# Patient Record
Sex: Male | Born: 1970 | Race: Black or African American | Hispanic: No | Marital: Single | Smoking: Never smoker
Health system: Southern US, Community
[De-identification: ages and names within clinical notes are randomized; demographics above are authoritative.]

## PROBLEM LIST (undated history)

## (undated) DIAGNOSIS — I10 Essential (primary) hypertension: Secondary | ICD-10-CM

## (undated) HISTORY — DX: Essential (primary) hypertension: I10

---

## 2005-07-20 ENCOUNTER — Ambulatory Visit: Payer: Self-pay | Admitting: Family Medicine

## 2005-08-29 ENCOUNTER — Ambulatory Visit: Payer: Self-pay | Admitting: Family Medicine

## 2005-11-02 ENCOUNTER — Ambulatory Visit: Payer: Self-pay | Admitting: Family Medicine

## 2005-12-20 ENCOUNTER — Ambulatory Visit: Payer: Self-pay | Admitting: Family Medicine

## 2006-04-11 ENCOUNTER — Ambulatory Visit: Payer: Self-pay | Admitting: Family Medicine

## 2006-07-29 ENCOUNTER — Ambulatory Visit: Payer: Self-pay | Admitting: Family Medicine

## 2006-07-31 ENCOUNTER — Encounter: Payer: Self-pay | Admitting: Family Medicine

## 2006-07-31 DIAGNOSIS — J309 Allergic rhinitis, unspecified: Secondary | ICD-10-CM | POA: Insufficient documentation

## 2006-07-31 DIAGNOSIS — E669 Obesity, unspecified: Secondary | ICD-10-CM

## 2006-07-31 DIAGNOSIS — I10 Essential (primary) hypertension: Secondary | ICD-10-CM | POA: Insufficient documentation

## 2006-12-05 ENCOUNTER — Ambulatory Visit: Payer: Self-pay | Admitting: Family Medicine

## 2006-12-05 DIAGNOSIS — E785 Hyperlipidemia, unspecified: Secondary | ICD-10-CM

## 2007-03-03 ENCOUNTER — Encounter (INDEPENDENT_AMBULATORY_CARE_PROVIDER_SITE_OTHER): Payer: Self-pay | Admitting: Family Medicine

## 2007-03-05 ENCOUNTER — Ambulatory Visit: Payer: Self-pay | Admitting: Family Medicine

## 2007-03-05 LAB — CONVERTED CEMR LAB
ALT: 27 units/L (ref 0–53)
AST: 16 units/L (ref 0–37)
Basophils Absolute: 0 10*3/uL (ref 0.0–0.1)
Basophils Relative: 0 % (ref 0–1)
Calcium: 9.5 mg/dL (ref 8.4–10.5)
Chloride: 103 meq/L (ref 96–112)
Cholesterol, target level: 200 mg/dL
Creatinine, Ser: 1.17 mg/dL (ref 0.40–1.50)
LDL Goal: 160 mg/dL
MCHC: 33.8 g/dL (ref 30.0–36.0)
Monocytes Absolute: 0.9 10*3/uL — ABNORMAL HIGH (ref 0.2–0.7)
Neutro Abs: 6.4 10*3/uL (ref 1.7–7.7)
Neutrophils Relative %: 58 % (ref 43–77)
Platelets: 314 10*3/uL (ref 150–400)
Potassium: 4.3 meq/L (ref 3.5–5.3)
RDW: 13.6 % (ref 11.5–14.0)
Sodium: 140 meq/L (ref 135–145)
Total CHOL/HDL Ratio: 3.9
Total Protein: 7.5 g/dL (ref 6.0–8.3)
VLDL: 19 mg/dL (ref 0–40)

## 2007-08-19 ENCOUNTER — Ambulatory Visit: Payer: Self-pay | Admitting: Family Medicine

## 2007-08-20 ENCOUNTER — Telehealth (INDEPENDENT_AMBULATORY_CARE_PROVIDER_SITE_OTHER): Payer: Self-pay | Admitting: *Deleted

## 2007-08-20 ENCOUNTER — Encounter (INDEPENDENT_AMBULATORY_CARE_PROVIDER_SITE_OTHER): Payer: Self-pay | Admitting: Family Medicine

## 2007-08-20 LAB — CONVERTED CEMR LAB
BUN: 18 mg/dL (ref 6–23)
Creatinine, Ser: 1.03 mg/dL (ref 0.40–1.20)
Potassium: 3.9 meq/L (ref 3.5–5.3)

## 2007-08-21 ENCOUNTER — Encounter: Payer: Self-pay | Admitting: Family Medicine

## 2007-11-19 ENCOUNTER — Ambulatory Visit: Payer: Self-pay | Admitting: Family Medicine

## 2008-03-23 ENCOUNTER — Ambulatory Visit: Payer: Self-pay | Admitting: Family Medicine

## 2008-03-24 ENCOUNTER — Encounter (INDEPENDENT_AMBULATORY_CARE_PROVIDER_SITE_OTHER): Payer: Self-pay | Admitting: Family Medicine

## 2008-03-24 LAB — CONVERTED CEMR LAB
Potassium: 4 meq/L (ref 3.5–5.3)
Sodium: 138 meq/L (ref 135–145)

## 2008-04-20 ENCOUNTER — Encounter (INDEPENDENT_AMBULATORY_CARE_PROVIDER_SITE_OTHER): Payer: Self-pay | Admitting: Family Medicine

## 2008-08-10 ENCOUNTER — Ambulatory Visit: Payer: Self-pay | Admitting: Family Medicine

## 2009-01-26 ENCOUNTER — Encounter (INDEPENDENT_AMBULATORY_CARE_PROVIDER_SITE_OTHER): Payer: Self-pay | Admitting: Family Medicine

## 2009-04-28 ENCOUNTER — Ambulatory Visit: Payer: Self-pay | Admitting: Family Medicine

## 2009-05-02 ENCOUNTER — Encounter (INDEPENDENT_AMBULATORY_CARE_PROVIDER_SITE_OTHER): Payer: Self-pay | Admitting: Family Medicine

## 2009-05-03 LAB — CONVERTED CEMR LAB
Albumin: 4.2 g/dL (ref 3.5–5.2)
Bilirubin Urine: NEGATIVE
CO2: 25 meq/L (ref 19–32)
Cholesterol: 175 mg/dL (ref 0–200)
Glucose, Bld: 99 mg/dL (ref 70–99)
Ketones, ur: NEGATIVE mg/dL
Nitrite: NEGATIVE
Potassium: 4.4 meq/L (ref 3.5–5.3)
Sodium: 140 meq/L (ref 135–145)
Specific Gravity, Urine: 1.017 (ref 1.005–1.030)
Total Protein: 7.3 g/dL (ref 6.0–8.3)
Triglycerides: 117 mg/dL (ref <150)
pH: 6 (ref 5.0–8.0)

## 2010-03-26 ENCOUNTER — Emergency Department (HOSPITAL_COMMUNITY): Admission: EM | Admit: 2010-03-26 | Discharge: 2010-03-26 | Payer: Self-pay | Admitting: Emergency Medicine

## 2010-09-19 NOTE — Letter (Signed)
Summary: Historic Patient File  Historic Patient File   Imported By: Lind Guest 05/22/2010 15:50:41  _____________________________________________________________________  External Attachment:    Type:   Image     Comment:   External Document

## 2015-02-10 ENCOUNTER — Ambulatory Visit (INDEPENDENT_AMBULATORY_CARE_PROVIDER_SITE_OTHER): Payer: BLUE CROSS/BLUE SHIELD | Admitting: Orthopedic Surgery

## 2015-02-10 ENCOUNTER — Ambulatory Visit (INDEPENDENT_AMBULATORY_CARE_PROVIDER_SITE_OTHER): Payer: BLUE CROSS/BLUE SHIELD

## 2015-02-10 ENCOUNTER — Encounter: Payer: Self-pay | Admitting: Orthopedic Surgery

## 2015-02-10 VITALS — BP 127/83 | Ht 68.0 in | Wt 230.0 lb

## 2015-02-10 DIAGNOSIS — M25522 Pain in left elbow: Secondary | ICD-10-CM

## 2015-02-10 DIAGNOSIS — M7712 Lateral epicondylitis, left elbow: Secondary | ICD-10-CM | POA: Diagnosis not present

## 2015-02-10 MED ORDER — DICLOFENAC SODIUM 75 MG PO TBEC: 75.0000 mg | DELAYED_RELEASE_TABLET | Freq: Two times a day (BID) | ORAL | Status: DC

## 2015-02-10 NOTE — Patient Instructions (Signed)
Joint Injection  Care After  Refer to this sheet in the next few days. These instructions provide you with information on caring for yourself after you have had a joint injection. Your caregiver also may give you more specific instructions. Your treatment has been planned according to current medical practices, but problems sometimes occur. Call your caregiver if you have any problems or questions after your procedure.  After any type of joint injection, it is not uncommon to experience:  · Soreness, swelling, or bruising around the injection site.  · Mild numbness, tingling, or weakness around the injection site caused by the numbing medicine used before or with the injection.  It also is possible to experience the following effects associated with the specific agent after injection:  · Iodine-based contrast agents:  ¨ Allergic reaction (itching, hives, widespread redness, and swelling beyond the injection site).  · Corticosteroids (These effects are rare.):  ¨ Allergic reaction.  ¨ Increased blood sugar levels (If you have diabetes and you notice that your blood sugar levels have increased, notify your caregiver).  ¨ Increased blood pressure levels.  ¨ Mood swings.  · Hyaluronic acid in the use of viscosupplementation.  ¨ Temporary heat or redness.  ¨ Temporary rash and itching.  ¨ Increased fluid accumulation in the injected joint.  These effects all should resolve within a day after your procedure.   HOME CARE INSTRUCTIONS  · Limit yourself to light activity the day of your procedure. Avoid lifting heavy objects, bending, stooping, or twisting.  · Take prescription or over-the-counter pain medication as directed by your caregiver.  · You may apply ice to your injection site to reduce pain and swelling the day of your procedure. Ice may be applied 03-04 times:  ¨ Put ice in a plastic bag.  ¨ Place a towel between your skin and the bag.  ¨ Leave the ice on for no longer than 15-20 minutes each time.  SEEK  IMMEDIATE MEDICAL CARE IF:   · Pain and swelling get worse rather than better or extend beyond the injection site.  · Numbness does not go away.  · Blood or fluid continues to leak from the injection site.  · You have chest pain.  · You have swelling of your face or tongue.  · You have trouble breathing or you become dizzy.  · You develop a fever, chills, or severe tenderness at the injection site that last longer than 1 day.  MAKE SURE YOU:  · Understand these instructions.  · Watch your condition.  · Get help right away if you are not doing well or if you get worse.  Document Released: 04/19/2011 Document Revised: 10/29/2011 Document Reviewed: 04/19/2011  ExitCare® Patient Information ©2015 [employer]. This information is not intended to replace advice given to you by your health care provider. Make sure you discuss any questions you have with your health care provider.

## 2015-02-10 NOTE — Progress Notes (Signed)
NEW  Patient ID: Shaun Mcclure, male   DOB: May 12, 1971, 44 y.o.   MRN: [id number]  Chief Complaint  Patient presents with  . Follow-up    Left elbow pain, ref z hall     Shaun Mcclure is a 44 y.o. male.   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 Presents with two-month history of lateral elbow pain. He did have some medial elbow pain which went away without treatment. He took 2 weeks of diclofenac he didn't get any better. He complains of pain, swelling, stiffness lateral aspect left elbow described as aching and throbbing constant 6 out of 10. Certain activities increase the pain he also took some over-the-counter Advil without relief  He's healthy he has seasonal allergies and otherwise normal review of systems Review of Systems See hpi  Past Medical History  Diagnosis Date  . Hypertension     No past surgical history on file.  No family history on file.  Social History History  Substance Use Topics  . Smoking status: Not on file  . Smokeless tobacco: Not on file  . Alcohol Use: Not on file    Not on File  Current Outpatient Prescriptions  Medication Sig Dispense Refill  . diclofenac (VOLTAREN) 75 MG EC tablet Take 1 tablet (75 mg total) by mouth 2 (two) times daily with a meal. 60 tablet 0   No current facility-administered medications for this visit.       Physical Exam Blood pressure 127/83, height 5\' 8"  (1.727 m), weight 230 lb (104.327 kg). Physical Exam The patient is well developed well nourished and well groomed. Orientation to person place and time is normal  Mood is pleasant. Epitrochlear lymph nodes are normal. He has no swelling over the lateral portion of his elbow he has full range of motion. The elbow is stable to varus valgus stress testing and the motor exam is normal there is no atrophy. The skin shows no rash lesion or erythema. Normal sensation in the hand and  wrist and normal pulse without lymphadenopathy.    Data Reviewed [facility] lateral x-ray left elbow ordered and I interpreted that as normal  Assessment Encounter Diagnosis  Name Primary?  . Tennis elbow, left Yes    Plan Injection, continue diclofenac for 4 weeks. Tennis elbow brace. Ice massage. Recheck 4 weeks.

## 2015-03-29 ENCOUNTER — Ambulatory Visit (INDEPENDENT_AMBULATORY_CARE_PROVIDER_SITE_OTHER): Payer: BLUE CROSS/BLUE SHIELD | Admitting: Orthopedic Surgery

## 2015-03-29 VITALS — BP 123/79 | Ht 68.0 in | Wt 224.0 lb

## 2015-03-29 DIAGNOSIS — M654 Radial styloid tenosynovitis [de Quervain]: Secondary | ICD-10-CM

## 2015-03-29 NOTE — Progress Notes (Signed)
Patient ID: Shaun Mcclure, male   DOB: [birth year], 44 y.o.   MRN: [id number]  Follow up visit  Chief Complaint  Patient presents with  . Follow-up    6 week follow up left elbow s/p brace + injection    BP 123/79 mmHg  Ht  (1.727 m)  Wt 224 lb (101.606 kg)  BMI 34.07 kg/m2  No diagnosis found.  Follow-up for tennis elbow  Patient also has pain in his left wrist did complain of that last time available seem to be worse retreated that with an injection in a brace  His wrist is hurting is painful ulnar deviation tenderness over the first extensor compartment  He has obvious de Quervain's syndrome  We gave him options of anti-inflammatory medication topical medication or injection. He settled on injection  We injected the first extensor compartment tendon sheath  Verbal consent timeout to confirm left wrist first extensor compartment as injection site  We did alcohol and ethyl chloride to prepare for injection then injected 40 mg of Depo-Medrol into the tendon sheath of the first extensor compartment no comp  Follow-up 6 weeks

## 2015-05-10 ENCOUNTER — Encounter: Payer: Self-pay | Admitting: Orthopedic Surgery

## 2015-05-10 ENCOUNTER — Ambulatory Visit: Payer: BLUE CROSS/BLUE SHIELD | Admitting: Orthopedic Surgery

## 2016-05-05 ENCOUNTER — Emergency Department (HOSPITAL_COMMUNITY)
Admission: EM | Admit: 2016-05-05 | Discharge: 2016-05-05 | Disposition: A | Discharge status: Home / Self Care | Payer: BLUE CROSS/BLUE SHIELD | Attending: Emergency Medicine | Admitting: Emergency Medicine

## 2016-05-05 ENCOUNTER — Encounter (HOSPITAL_COMMUNITY): Payer: Self-pay

## 2016-05-05 DIAGNOSIS — Z79899 Other long term (current) drug therapy: Secondary | ICD-10-CM | POA: Insufficient documentation

## 2016-05-05 DIAGNOSIS — M545 Low back pain, unspecified: Secondary | ICD-10-CM

## 2016-05-05 DIAGNOSIS — I10 Essential (primary) hypertension: Secondary | ICD-10-CM | POA: Insufficient documentation

## 2016-05-05 MED ORDER — KETOROLAC TROMETHAMINE 60 MG/2ML IM SOLN
60.0000 mg | Freq: Once | INTRAMUSCULAR | Status: AC
  Administered 2016-05-05: 60 mg via INTRAMUSCULAR
  Filled 2016-05-05: qty 2

## 2016-05-05 MED ORDER — METHOCARBAMOL 500 MG PO TABS: 500.0000 mg | ORAL_TABLET | Freq: Two times a day (BID) | ORAL | 0 refills | Status: AC | PRN

## 2016-05-05 MED ORDER — MELOXICAM 15 MG PO TABS: 15.0000 mg | ORAL_TABLET | Freq: Every day | ORAL | 0 refills | Status: AC

## 2016-05-05 MED ORDER — DEXAMETHASONE SODIUM PHOSPHATE 10 MG/ML IJ SOLN
6.0000 mg | Freq: Once | INTRAMUSCULAR | Status: AC
  Administered 2016-05-05: 6 mg via INTRAMUSCULAR
  Filled 2016-05-05: qty 1

## 2016-05-05 NOTE — ED Provider Notes (Signed)
[facility]-EMER[facility]ENCY DEPT Provider Note   CSN: [id number] Arrival date & time: 05/05/16  2004  By signing my name below, I, Shaun Mcclure, attest that this documentation has been prepared under the direction and in the presence of Shaun HongBrian Aarohi Redditt, MD. Electronically Signed: Javier Dockerobert Ryan Mcclure, ER Scribe. 03/31/2016. 10:17 PM.   History   Chief Complaint Chief Complaint  Patient presents with  . Back Pain   The history is provided by the patient. No language interpreter was used.    HPI Comments: Shaun Mcclure is a 45 y.o. male who presents to the Emergency Department complaining of stabbing, burning low back pain since yesterday afternoon that radiates to his right knee. He denies cancer, numbness or weakness in legs, flank pain, or dysuria. He denies loss of bowl or bladder control. Denies red flags for pathologic back pain.  Sx are constant, worse with movement.  No hx of back surgery   Past Medical History:  Diagnosis Date  . Hypertension     Patient Active Problem List   Diagnosis Date Noted  . De Quervain's syndrome (tenosynovitis) 03/29/2015  . HYPERLIPIDEMIA 12/05/2006  . OBESITY 07/31/2006  . HYPERTENSION 07/31/2006  . ALLERGIC RHINITIS 07/31/2006    History reviewed. No pertinent surgical history.   Home Medications    Prior to Admission medications   Medication Sig Start Date End Date Taking? Authorizing Provider  lisinopril (PRINIVIL,ZESTRIL) 40 MG tablet Take 40 mg by mouth daily.    Historical Provider, MD  meloxicam (MOBIC) 15 MG tablet Take 1 tablet (15 mg total) by mouth daily. 05/05/16   Shaun HongBrian Shaun Armijo, MD  methocarbamol (ROBAXIN) 500 MG tablet Take 1 tablet (500 mg total) by mouth 2 (two) times daily as needed for muscle spasms. 05/05/16   Shaun HongBrian Shaun Belknap, MD    Family History History reviewed. No pertinent family history.  Social History Social History  Substance Use Topics  . Smoking status: Never Smoker  . Smokeless tobacco: Never Used  . Alcohol use Not  on file     Allergies   Review of patient's allergies indicates no known allergies.   Review of Systems Review of Systems  Constitutional: Negative for chills and fever.  Genitourinary: Negative for dysuria, flank pain, frequency and urgency.  Musculoskeletal: Positive for back pain. Negative for joint swelling.  Neurological: Negative for weakness and numbness.    Physical Exam Updated Vital Signs BP 157/96 (BP Location: Left Arm)   Pulse 66   Temp 98.2 F (36.8 C) (Oral)   Resp 20   Ht 5\' 9"  (1.753 m)   Wt 215 lb (97.5 kg)   SpO2 97%   BMI 31.75 kg/m   Physical Exam  Constitutional: He is oriented to person, place, and time. He appears well-developed and well-nourished. No distress.  HENT:  Head: Normocephalic and atraumatic.  Eyes: Pupils are equal, round, and reactive to light.  Neck: Neck supple.  Cardiovascular: Normal rate.   Pulmonary/Chest: Effort normal. No respiratory distress.  Musculoskeletal: Normal range of motion.  Reproducible TTP over right SI joint and lumbar paraspinal muscles.   Neurological: He is alert and oriented to person, place, and time. Coordination normal.  Normal strength and sensation of the bilateral lower extremities, normal gait  Skin: Skin is warm and dry. He is not diaphoretic.  Psychiatric: He has a normal mood and affect. His behavior is normal.  Nursing note and vitals reviewed.    ED Treatments / Results  DIAGNOSTIC STUDIES: Oxygen Saturation is 97% on RA,  normal by my interpretation.    COORDINATION OF CARE: 10:20 PM Discussed treatment plan with pt at bedside which includes IM steroids and pt agreed to plan.  Labs (all labs ordered are listed, but only abnormal results are displayed) Labs Reviewed - No data to display  EKG  EKG Interpretation None       Radiology No results found.  Procedures Procedures (including critical care time)  Medications Ordered in ED Medications  ketorolac (TORADOL) injection  60 mg (60 mg Intramuscular Given 05/05/16 2306)  dexamethasone (DECADRON) injection 6 mg (6 mg Intramuscular Given 05/05/16 2307)     Initial Impression / Assessment and Plan / ED Course  I have reviewed the triage vital signs and the nursing notes.  Pertinent labs & imaging results that were available during my care of the patient were reviewed by me and considered in my medical decision making (see chart for details).  Clinical Course    Decadron given, pain medication for home as below, likely musculoskeletal cause of pain, doubt pathologic source  I personally performed the services described in this documentation, which was scribed in my presence. The recorded information has been reviewed and is accurate.     Final Clinical Impressions(s) / ED Diagnoses   Final diagnoses:  Right-sided low back pain without sciatica    New Prescriptions New Prescriptions   MELOXICAM (MOBIC) 15 MG TABLET    Take 1 tablet (15 mg total) by mouth daily.   METHOCARBAMOL (ROBAXIN) 500 MG TABLET    Take 1 tablet (500 mg total) by mouth 2 (two) times daily as needed for muscle spasms.               Shaun Hong, MD 05/05/16 [id number]

## 2016-05-05 NOTE — ED Notes (Addendum)
Pt report slight improvement in pain down to 8/10 after IM medications given. Pt now report that "its wore off" and is pain is now back to what it was upon arrival. Shaun Mcclure made aware, no new orders, discharge instructions reviewed with patient. Teachback

## 2016-05-05 NOTE — ED Notes (Signed)
Numerous family members to nursing station upset stating "he was suppose to get a shot", "his pain is worse", "he is deteriorating". Explained to both family members, on two occasions that no orders has been placed yet, and that I will inquire with Dr. Hyacinth MeekerMiller regarding pain medication

## 2016-05-05 NOTE — ED Triage Notes (Signed)
Patient states he was working out yesterday and started having lower back pain on the right side, with radiation into right leg. Patient denies any other pain or symptoms. Patient was seen at urgent care this morning and prescribed mobic and flexeril states pain has not improved. Patent ambulatory into triage with steady gait.

## 2016-05-05 NOTE — Discharge Instructions (Signed)

## 2019-07-13 ENCOUNTER — Ambulatory Visit (INDEPENDENT_AMBULATORY_CARE_PROVIDER_SITE_OTHER): Payer: Commercial Managed Care - PPO

## 2019-07-13 ENCOUNTER — Other Ambulatory Visit: Payer: Self-pay

## 2019-07-13 ENCOUNTER — Ambulatory Visit (INDEPENDENT_AMBULATORY_CARE_PROVIDER_SITE_OTHER): Payer: Commercial Managed Care - PPO | Admitting: Orthopedic Surgery

## 2019-07-13 VITALS — BP 202/121 | HR 88 | Temp 97.9°F | Ht 69.0 in | Wt 236.4 lb

## 2019-07-13 DIAGNOSIS — M25562 Pain in left knee: Secondary | ICD-10-CM

## 2019-07-13 NOTE — Progress Notes (Signed)
Shaun Mcclure  07/13/2019  Body mass index is 34.91 kg/m.   HISTORY SECTION :  Chief Complaint  Patient presents with  . New Patient (Initial Visit)    Left knee pain-stepped in hole end of summer   HPI The patient presents for evaluation of  (mild/moderate/severe/ ) mild to moderate pain, in the (right /left) left knee, for since this summer when he stepped in a hole, associated with swelling.  Prior treatment none  It seems he stepped in a hole at the end of the summer rested it got better then he stepped off a ladder at work and twisted it again and since then he has had pain in the back of the knee with swelling not a lot of motion deficit   Review of Systems  All other systems reviewed and are negative.    has a past medical history of Hypertension.   No past surgical history on file.  Body mass index is 34.91 kg/m.   No Known Allergies   Current Outpatient Medications:  .  lisinopril (PRINIVIL,ZESTRIL) 40 MG tablet, Take 40 mg by mouth daily., Disp: , Rfl:  .  meloxicam (MOBIC) 15 MG tablet, Take 1 tablet (15 mg total) by mouth daily. (Patient not taking: Reported on 07/13/2019), Disp: 30 tablet, Rfl: 0 .  methocarbamol (ROBAXIN) 500 MG tablet, Take 1 tablet (500 mg total) by mouth 2 (two) times daily as needed for muscle spasms. (Patient not taking: Reported on 07/13/2019), Disp: 20 tablet, Rfl: 0   PHYSICAL EXAM SECTION: 1) BP (!) 202/121   Pulse 88   Temp 97.9 F (36.6 C)   Ht 5\' 9"  (1.753 m)   Wt 236 lb 6.4 oz (107.2 kg)   BMI 34.91 kg/m   Body mass index is 34.91 kg/m. General appearance: Well-developed well-nourished no gross deformities  2) Cardiovascular normal pulse and perfusion in the lower extremities normal color without edema  3) Neurologically deep tendon reflexes are equal and normal, no sensation loss or deficits no pathologic reflexes  4) Psychological: Awake alert and oriented x3 mood and affect normal  5) Skin no lacerations or  ulcerations no nodularity no palpable masses, no erythema or nodularity  6) Musculoskeletal:  Right knee normal range of motion stability and strength  Left knee large joint effusion however he can still flex the knee does not straighten all the way though he has no tenderness in the joint lines all the pain is in the back of the knee McMurray's test were negative muscle tone was normal   MEDICAL DECISION SECTION:  Encounter Diagnosis  Name Primary?  . Left knee pain, unspecified chronicity Yes    Imaging X-ray see report but it does show some mild arthritis medial compartment  Differential diagnosis meniscal tear is included  Plan:  (Rx., Inj., surg., Frx, MRI/CT, XR:2)  Aspiration injection left knee ibuprofen twice a day ice follow-up in 2 weeks if no improvement MRI  Procedure note injection and aspiration left knee joint  Verbal consent was obtained to aspirate and inject the left knee joint   Timeout was completed to confirm the site of aspiration and injection  An 18-gauge needle was used to aspirate the left knee joint from a suprapatellar lateral approach.  The medications used were 40 mg of Depo-Medrol and 1% lidocaine 3 cc  Anesthesia was provided by ethyl chloride and the skin was prepped with alcohol.  After cleaning the skin with alcohol an 18-gauge needle was used to aspirate the  right knee joint.  We obtained 45 cc of fluid clear with a few particles of cartilage in the knee  We followed this by injection of 40 mg of Depo-Medrol and 3 cc 1% lidocaine.  There were no complications. A sterile bandage was applied.  3:59 PM Fuller Canada, MD  07/13/2019

## 2019-07-13 NOTE — Patient Instructions (Addendum)
Ice daily   Ibuprofen 400 mg 2 x a day    Meniscus Tear  A meniscus tear is a knee injury that happens when a piece of the meniscus is torn. The meniscus is a thick, rubbery, wedge-shaped cartilage in the knee. Two menisci are located in each knee. They sit between the upper bone (femur) and lower bone (tibia) that make up the knee joint. Each meniscus acts as a shock absorber for the knee. A torn meniscus is one of the most common types of knee injuries. This injury can range from mild to severe. Surgery may be needed to repair a severe tear. What are the causes? This condition may be caused by any kneeling, squatting, twisting, or pivoting movement. Sports-related injuries are the most common cause. These often occur from:  Running and stopping suddenly. ? Changing direction. ? Being tackled or knocked off your feet.  Lifting or carrying heavy weights. As people get older, their menisci get thinner and weaker. In these people, tears can happen more easily, such as from climbing stairs. What increases the risk? You are more likely to develop this condition if you:  Play contact sports.  Have a job that requires kneeling or squatting.  Are male.  Are over 70 years old. What are the signs or symptoms? Symptoms of this condition include:  Knee pain, especially at the side of the knee joint. You may feel pain when the injury occurs, or you may only hear a pop and feel pain later.  A feeling that your knee is clicking, catching, locking, or giving way.  Not being able to fully bend or extend your knee.  Bruising or swelling in your knee. How is this diagnosed? This condition may be diagnosed based on your symptoms and a physical exam. You may also have tests, such as:  X-rays.  MRI.  A procedure to look inside your knee with a narrow surgical telescope (arthroscopy). You may be referred to a knee specialist (orthopedic surgeon). How is this treated? Treatment for this  injury depends on the severity of the tear. Treatment for a mild tear may include:  Rest.  Medicine to reduce pain and swelling. This is usually a nonsteroidal anti-inflammatory drug (NSAID), like ibuprofen.  A knee brace, sleeve, or wrap.  Using crutches or a walker to keep weight off your knee and to help you walk.  Exercises to strengthen your knee (physical therapy). You may need surgery if you have a severe tear or if other treatments are not working. Follow these instructions at home: If you have a brace, sleeve, or wrap:  Wear it as told by your health care provider. Remove it only as told by your health care provider.  Loosen the brace, sleeve, or wrap if your toes tingle, become numb, or turn cold and blue.  Keep the brace, sleeve, or wrap clean and dry.  If the brace, sleeve, or wrap is not waterproof: ? Do not let it get wet. ? Cover it with a watertight covering when you take a bath or shower. Managing pain and swelling   Take over-the-counter and prescription medicines only as told by your health care provider.  If directed, put ice on your knee: ? If you have a removable brace, sleeve, or wrap, remove it as told by your health care provider. ? Put ice in a plastic bag. ? Place a towel between your skin and the bag. ? Leave the ice on for 20 minutes, 2-3 times per day.  Move your toes often to avoid stiffness and to lessen swelling.  Raise (elevate) the injured area above the level of your heart while you are sitting or lying down. Activity  Do not use the injured limb to support your body weight until your health care provider says that you can. Use crutches or a walker as told by your health care provider.  Return to your normal activities as told by your health care provider. Ask your health care provider what activities are safe for you.  Perform range-of-motion exercises only as told by your health care provider.  Begin doing exercises to strengthen  your knee and leg muscles only as told by your health care provider. After you recover, your health care provider may recommend these exercises to help prevent another injury. General instructions  Use a knee brace, sleeve, or wrap as told by your health care provider.  Ask your health care provider when it is safe to drive if you have a brace, sleeve, or wrap on your knee.  Do not use any products that contain nicotine or tobacco, such as cigarettes, e-cigarettes, and chewing tobacco. If you need help quitting, ask your health care provider.  Ask your health care provider if the medicine prescribed to you: ? Requires you to avoid driving or using heavy machinery. ? Can cause constipation. You may need to take these actions to prevent or treat constipation:  Drink enough fluid to keep your urine pale yellow.  Take over-the-counter or prescription medicines.  Eat foods that are high in fiber, such as beans, whole grains, and fresh fruits and vegetables.  Limit foods that are high in fat and processed sugars, such as fried or sweet foods.  Keep all follow-up visits as told by your health care provider. This is important. Contact a health care provider if:  You have a fever.  Your knee becomes red, tender, or swollen.  Your pain medicine is not helping.  Your symptoms get worse or do not improve after 2 weeks of home care. Summary  A meniscus tear is a knee injury that happens when a piece of the meniscus is torn.  Treatment for this injury depends on the severity of the tear. You may need surgery if you have a severe tear or if other treatments are not working.  Rest, ice, and raise (elevate) your injured knee as told by your health care provider. This will help lessen pain and swelling.  Contact a health care provider if you have new symptoms, or your symptoms get worse or do not improve after 2 weeks of home care.  Keep all follow-up visits as told by your health care  provider. This is important. This information is not intended to replace advice given to you by your health care provider. Make sure you discuss any questions you have with your health care provider. Document Released: 10/27/2002 Document Revised: 02/18/2018 Document Reviewed: 02/18/2018 Elsevier Patient Education  2020 [employer].

## 2019-07-27 ENCOUNTER — Encounter: Payer: Self-pay | Admitting: Orthopedic Surgery

## 2019-07-27 ENCOUNTER — Ambulatory Visit (INDEPENDENT_AMBULATORY_CARE_PROVIDER_SITE_OTHER): Payer: Commercial Managed Care - PPO | Admitting: Orthopedic Surgery

## 2019-07-27 ENCOUNTER — Other Ambulatory Visit: Payer: Self-pay

## 2019-07-27 VITALS — BP 210/126 | HR 92 | Ht 69.0 in | Wt 225.0 lb

## 2019-07-27 DIAGNOSIS — M1710 Unilateral primary osteoarthritis, unspecified knee: Secondary | ICD-10-CM

## 2019-07-27 DIAGNOSIS — M23322 Other meniscus derangements, posterior horn of medial meniscus, left knee: Secondary | ICD-10-CM

## 2019-07-27 DIAGNOSIS — M25462 Effusion, left knee: Secondary | ICD-10-CM

## 2019-07-27 DIAGNOSIS — M1712 Unilateral primary osteoarthritis, left knee: Secondary | ICD-10-CM | POA: Diagnosis not present

## 2019-07-27 NOTE — Progress Notes (Signed)
Progress Note   Patient ID: Shaun Mcclure, male   DOB: 29-Dec-1970, 48 y.o.   MRN: [id number]  Body mass index is 33.23 kg/m.  Chief Complaint  Patient presents with  . Knee Pain    left     Encounter Diagnoses  Name Primary?  . Derangement of posterior horn of medial meniscus of left knee Yes  . Primary localized osteoarthritis of knee   . Effusion of left knee     [name] is stepped in a hole this end of the summer presented on November 23 with large effusion medial joint line pain and had an aspiration injection but he has returned to work and he has noticed that the knee is swollen again very tight hard to bend or straighten the knee.  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 thinks this may have led to some more swelling but his acute injury was in the late summer and he has not improved despite the aspiration and the activity modification and the oral anti-inflammatories which was ibuprofen      Review of Systems  Constitutional:       Negative review of systems      BP (!) 210/126   Pulse 92   Ht 5\' 9"  (1.753 m)   Wt 225 lb (102.1 kg)   BMI 33.23 kg/m   Physical Exam Left knee large joint effusion tenderness over the medial joint line Range of motion is limited flexion and extension cannot fully extend the knee Ligaments feel stable Muscle tone is normal Neurovascular exam is intact Gait is abnormal slight limp    Medical decisions:  (Established problem worse, x-ray ,physical therapy, over-the-counter medicines, read outside film or summarize x-ray)  Data  Imaging:   Previous imaging showed some arthritis medial compartment  Encounter Diagnoses  Name Primary?  . Derangement of posterior horn of medial meniscus of left knee Yes  . Primary localized osteoarthritis of knee   . Effusion of left knee     PLAN:  Aspiration only left knee Out of work for rest 2 days Ice Ibuprofen Knee sleeve MRI  Call results  Aspiration left  knee procedure Site confirmation verbal consent was given knee was prepped with alcohol and ethyl chloride lateral approach 18-gauge needle 40+ cc clear yellow fluid aspirated No steroids injected      Arther Abbott, MD 07/27/2019 4:40 PM

## 2019-07-27 NOTE — Patient Instructions (Signed)
Note no work for 2 days  You have received an injection of steroids into the joint. 15% of patients will have increased pain within the 24 hours postinjection.   This is transient and will go away.   We recommend that you use ice packs on the injection site for 20 minutes every 2 hours and extra strength Tylenol 2 tablets every 8 as needed until the pain resolves.  If you continue to have pain after taking the Tylenol and using the ice please call the office for further instructions.  Take 800 mg ibuprofen in the morning and 800 mg at night  At the end of the night or day ice the knee for the 30 minutes and wear a knee sleeve until the MRI is done  You have been scheduled for an MRI scan  We will call your insurance company to do a precertification to get the MRI covered  You will receive a phone call regarding the date of the scan  Dr Aline Brochure will call you with the results

## 2019-08-03 ENCOUNTER — Other Ambulatory Visit: Payer: Self-pay | Admitting: *Deleted

## 2019-08-03 ENCOUNTER — Other Ambulatory Visit: Payer: Self-pay | Admitting: Radiology

## 2019-08-03 DIAGNOSIS — M23322 Other meniscus derangements, posterior horn of medial meniscus, left knee: Secondary | ICD-10-CM

## 2019-08-11 ENCOUNTER — Other Ambulatory Visit: Payer: Self-pay

## 2019-08-11 ENCOUNTER — Ambulatory Visit (HOSPITAL_COMMUNITY)
Admission: RE | Admit: 2019-08-11 | Discharge: 2019-08-11 | Disposition: A | Discharge status: Home / Self Care | Payer: Commercial Managed Care - PPO | Source: Ambulatory Visit | Attending: Orthopedic Surgery | Admitting: Orthopedic Surgery

## 2019-08-11 DIAGNOSIS — M23322 Other meniscus derangements, posterior horn of medial meniscus, left knee: Secondary | ICD-10-CM | POA: Diagnosis present

## 2019-08-20 ENCOUNTER — Telehealth: Payer: Self-pay | Admitting: Orthopedic Surgery

## 2019-08-20 NOTE — Telephone Encounter (Signed)
Patient has a medial meniscal tear and arthritis of the medial compartment message left results given.  Gave him my personal number for him to call me back

## 2021-02-22 IMAGING — MR MR KNEE*L* W/O CM
4 of 7 series · 14 of 40 positions shown · non-contrast
Comparison: 07/13/2019 x-rays

CLINICAL DATA: Twisting injury doing yard work. Persistent knee
pain and swelling.

EXAM:
MRI OF THE LEFT KNEE WITHOUT CONTRAST
TECHNIQUE: Multiplanar, multisequence MR imaging of the knee was performed. No
intravenous contrast was administered.

[Series 3: T2 fat-sat · axial · 4.0mm · 0.25mm/px · z∈[-58,+37]mm · 3 of 24 slices shown]
[im 5/24]
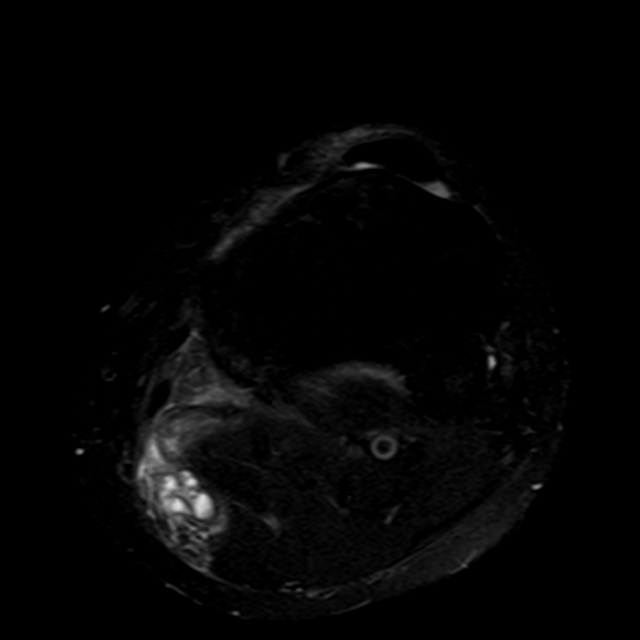
[im 14/24]
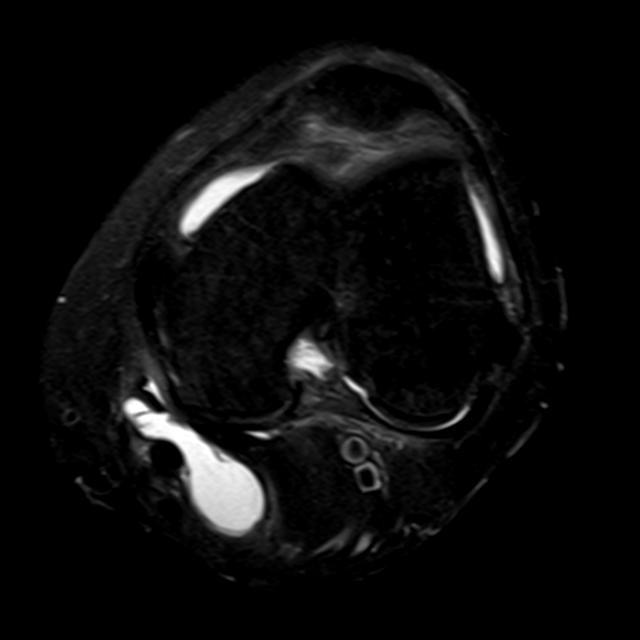
[im 24/24]
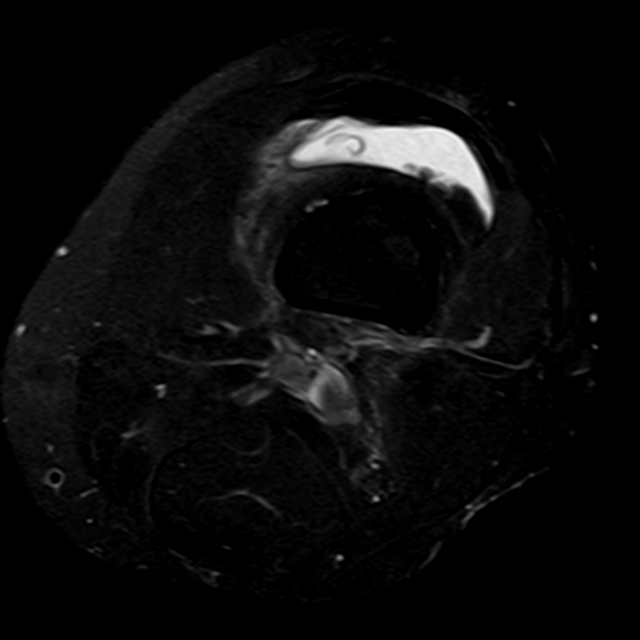

[Series 6: PD fat-sat · coronal · 3.0mm · 0.23mm/px · 5 of 36 slices shown (1 of 3)]
[im 1/36]
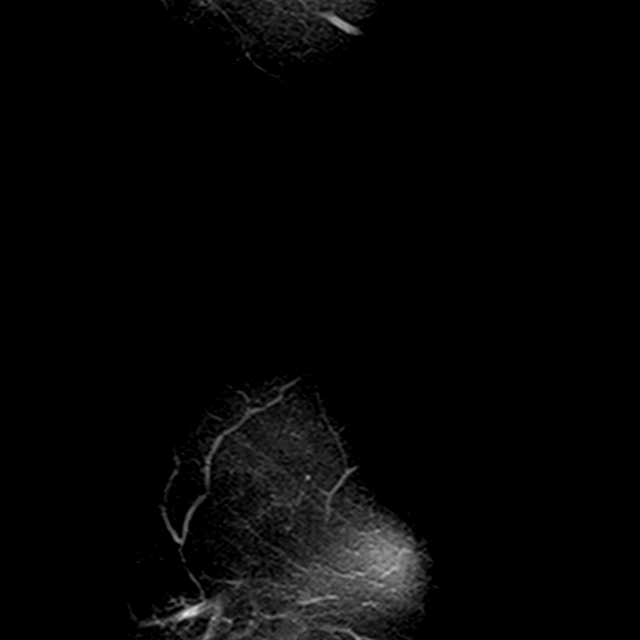
[im 6/36]
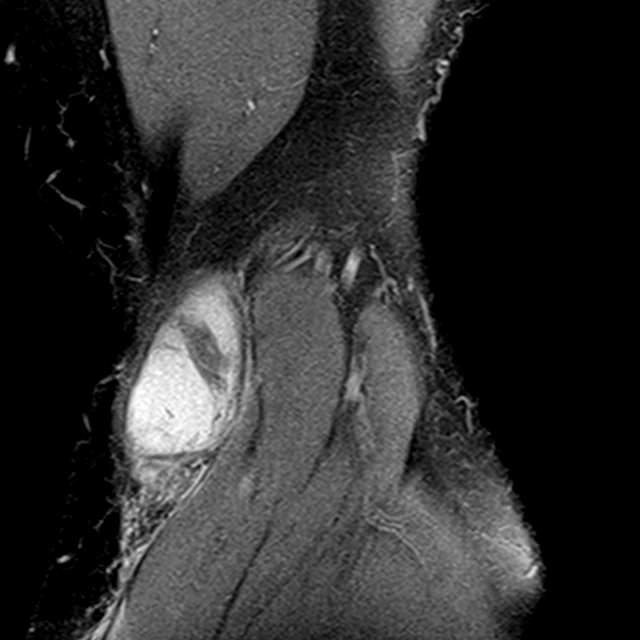
[im 12/36]
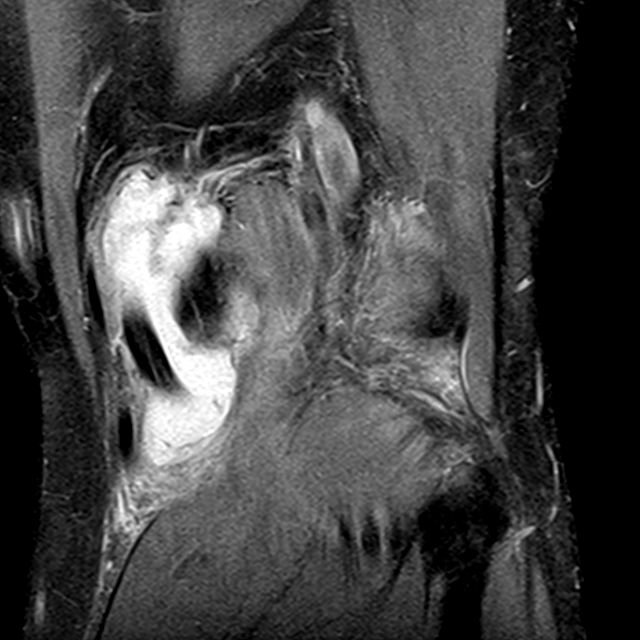
[im 18/36]
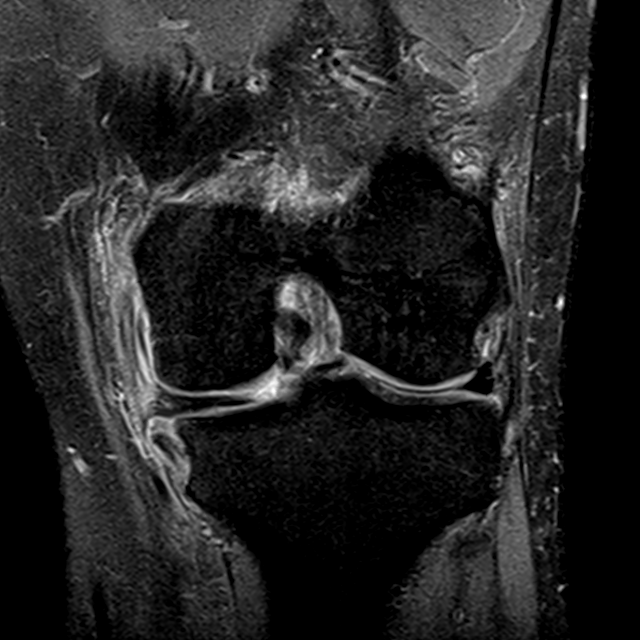
[im 30/36]
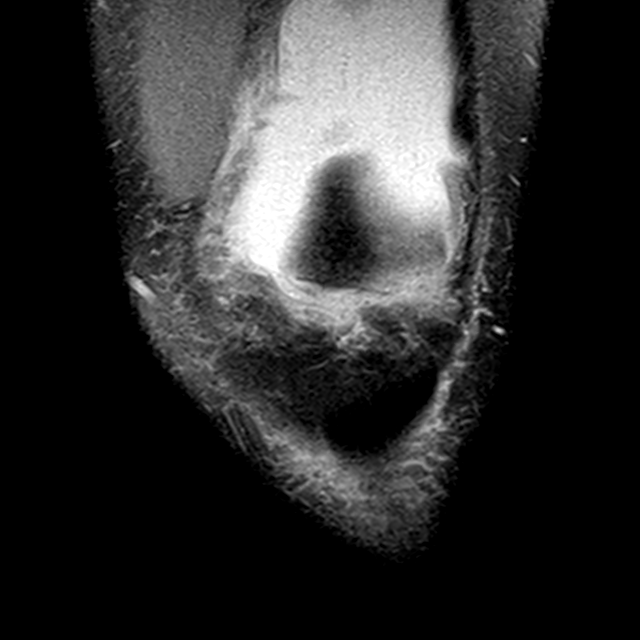

[Series 7: PD fat-sat · sagittal · 3.0mm · 0.23mm/px · 3 of 29 slices shown (2 of 3)]
[im 6/29]
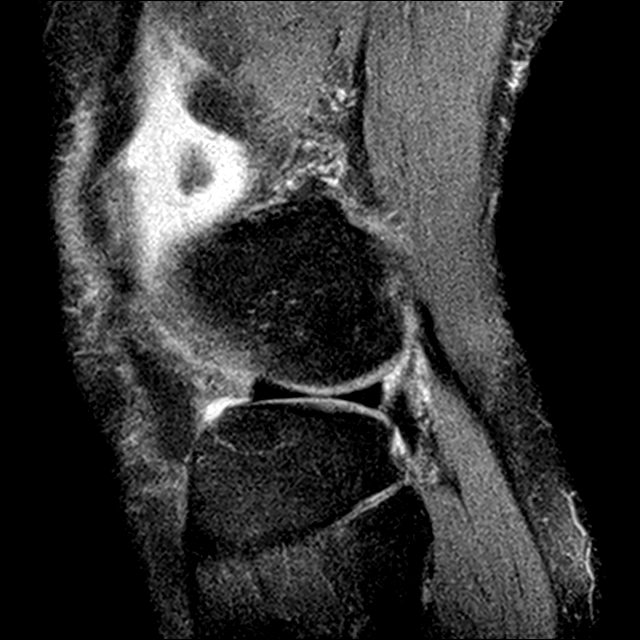
[im 17/29]
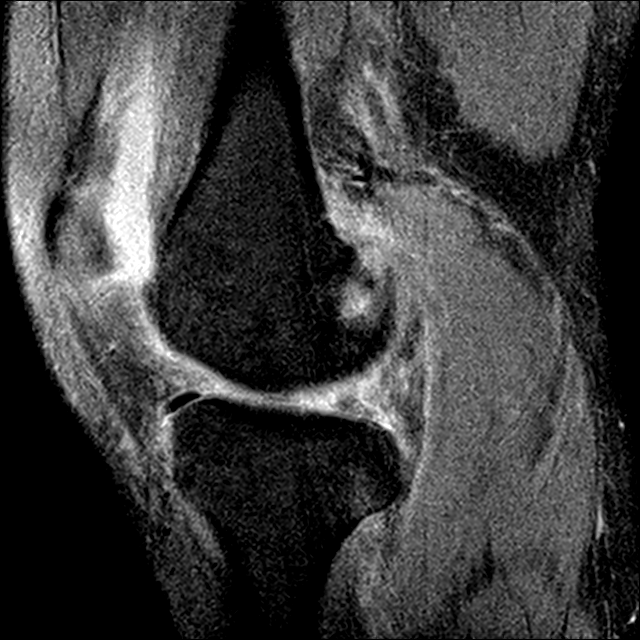
[im 29/29]
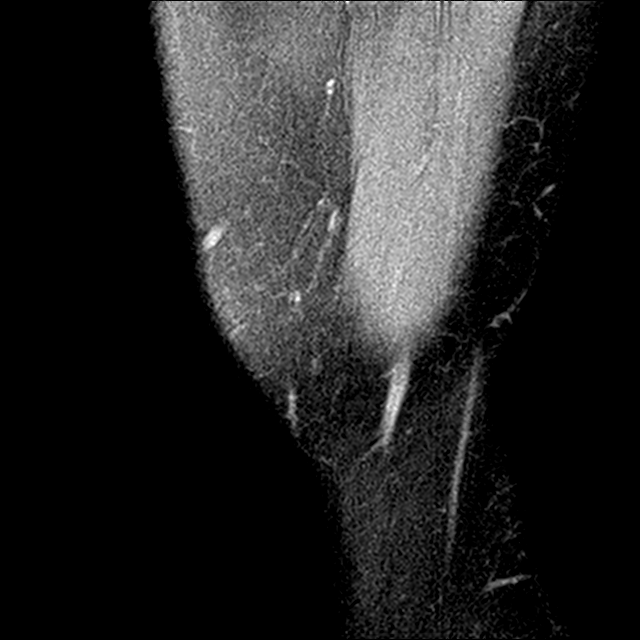

[Series 9: PD fat-sat · coronal · 2.0mm · 0.24mm/px · 3 of 15 slices shown (3 of 3)]
[im 1/15]
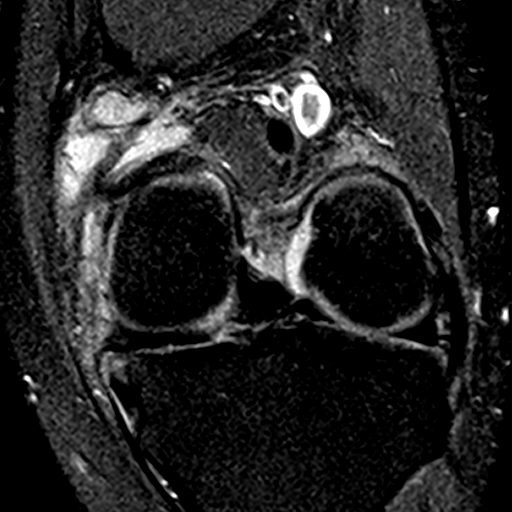
[im 8/15]
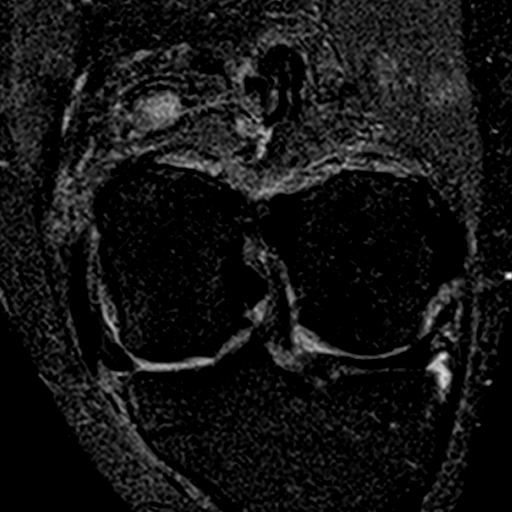
[im 15/15]
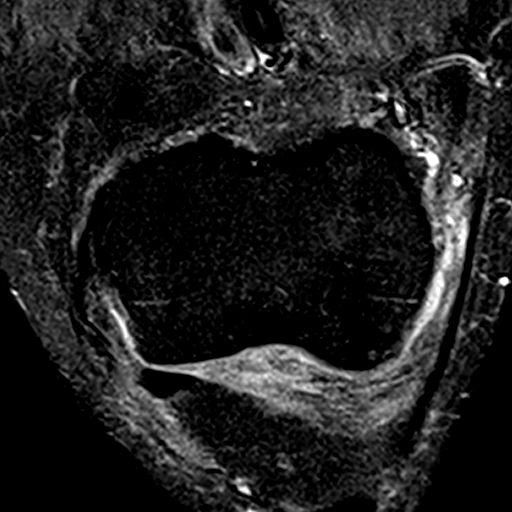

[14 of 40 positions shown; findings below may reference images not displayed]

FINDINGS: Examination is limited by patient motion.

MENISCI

Medial meniscus: Radial tear involving the posterior horn back near
the meniscal root with detachment and associated medial protrusion
of the meniscus estimated at 5.5 mm.

Lateral meniscus:  Intact

LIGAMENTS

Cruciates:  Intact

Collaterals:  Intact.  MCL and pes anserine bursitis noted.

CARTILAGE

Patellofemoral:  Normal articular cartilage.

Medial: Moderate to advanced degenerative chondrosis with areas of
full or near full-thickness cartilage loss particular involving the
medial femoral condyle articular cartilage. There is also early
joint space narrowing and spurring.

Lateral:  Mild degenerative chondrosis.

Joint:  Large joint effusion.

Popliteal Fossa:  Large leaking Baker's cyst containing some debris.

Extensor Mechanism: The patella retinacular structures are intact
and the quadriceps and patellar tendons are intact.

Bones: No acute bony findings. Stress related marrow edema involving
the medial femoral condyle and medial tibia related to the unstable
meniscus tear.

Other: Normal knee musculature.
IMPRESSION: 1. Large radial tear involving the posterior horn of the medial
meniscus back near the meniscal root with associated medial
protrusion of the meniscus.
2. Intact ligamentous structures and no acute bony findings.
3. Moderate to advanced medial compartment degenerative chondrosis
and stress related edema in the femur and tibia.
4. Large joint effusion and large leaking Baker's cyst.

## 2022-06-27 LAB — EXTERNAL GENERIC LAB PROCEDURE: COLOGUARD: NEGATIVE

## 2023-10-28 ENCOUNTER — Other Ambulatory Visit: Payer: Self-pay

## 2023-10-28 ENCOUNTER — Encounter (HOSPITAL_COMMUNITY): Payer: Self-pay | Admitting: *Deleted

## 2023-10-28 ENCOUNTER — Emergency Department (HOSPITAL_COMMUNITY)
Admission: EM | Admit: 2023-10-28 | Discharge: 2023-10-28 | Disposition: A | Discharge status: Home / Self Care | Attending: Emergency Medicine | Admitting: Emergency Medicine

## 2023-10-28 DIAGNOSIS — R2241 Localized swelling, mass and lump, right lower limb: Secondary | ICD-10-CM | POA: Diagnosis present

## 2023-10-28 DIAGNOSIS — M7989 Other specified soft tissue disorders: Secondary | ICD-10-CM

## 2023-10-28 MED ORDER — ENOXAPARIN SODIUM 120 MG/0.8ML IJ SOSY
1.0000 mg/kg | PREFILLED_SYRINGE | Freq: Once | INTRAMUSCULAR | Status: AC
  Administered 2023-10-28: 105 mg via SUBCUTANEOUS
  Filled 2023-10-28: qty 0.7

## 2023-10-28 NOTE — Discharge Instructions (Addendum)
 Please return tomorrow for a DVT ultrasound to rule out a clot in your leg. Check-in at admissions tomorrow and you will be directed to the correct department to have the study done.  You may have to wait a little bit of time because they will be working you in between scheduled appointments, however you will be seen in taking care of.  It is helpful to get here earlier (between 7 and 8 AM) see you be seen sooner.  Get help right away in the emergency department if he does experience sudden onset chest pain shortness of breath develop palpitations of the heart or lose consciousness.

## 2023-10-28 NOTE — ED Provider Notes (Signed)
 [facility] AT [facility] Provider Note   CSN: [id number] Arrival date & time: 10/28/23  1900     History  Chief Complaint  Patient presents with   Leg Pain    Shaun Mcclure is a 53 y.o. male who presents the emergency department with chief complaint of right leg pain and swelling.  He has had progressively worsening swelling in his right leg especially in the right calf which is tight for the past week or so.  Patient went to an urgent care and was told he probably needs to get a blood clot rule out so came here.  Unfortunately he has arrived after our ability to give him ultrasound rule out at this time.  He denies chest pain or shortness of breath   Leg Pain      Home Medications Prior to Admission medications   Medication Sig Start Date End Date Taking? Authorizing Provider  lisinopril (PRINIVIL,ZESTRIL) 40 MG tablet Take 40 mg by mouth daily.    [provider]  meloxicam (MOBIC) 15 MG tablet Take 1 tablet (15 mg total) by mouth daily. Patient not taking: Reported on 07/27/2019 05/05/16   Eber Hong, MD  methocarbamol (ROBAXIN) 500 MG tablet Take 1 tablet (500 mg total) by mouth 2 (two) times daily as needed for muscle spasms. Patient not taking: Reported on 07/27/2019 05/05/16   Eber Hong, MD      Allergies    Patient has no known allergies.    Review of Systems   Review of Systems  Physical Exam Updated Vital Signs BP (!) 145/99 (BP Location: Right Arm)   Pulse 74   Temp 97.7 F (36.5 C)   Resp 17   Ht 5\' 9"  (1.753 m)   Wt 106.1 kg   SpO2 99%   BMI 34.56 kg/m  Physical Exam Physical Exam  Nursing note and vitals reviewed. Constitutional: He appears well-developed and well-nourished. No distress.  HENT:  Head: Normocephalic and atraumatic.  Eyes: Conjunctivae normal are normal. No scleral icterus.  Neck: Normal range of motion. Neck supple.  Cardiovascular: Normal rate, regular rhythm and normal heart sounds.    Pulmonary/Chest: Effort normal and breath sounds normal. No respiratory distress.  Abdominal: Soft. There is no tenderness.  Musculoskeletal:   Right leg swelling as compared to the left.  Compartments of the calf are soft. Neurological: He is alert.  Skin: Skin is warm and dry. He is not diaphoretic.  Psychiatric: His behavior is normal.   ED Results / Procedures / Treatments   Labs (all labs ordered are listed, but only abnormal results are displayed) Labs Reviewed - No data to display  EKG None  Radiology No results found.  Procedures Procedures    Medications Ordered in ED Medications  enoxaparin (LOVENOX) 100 mg/mL injection 1 mg/kg (has no administration in time range)    ED Course/ Medical Decision Making/ A&P                                 Medical Decision Making Risk Prescription drug management.   Here for blood clot rule out.  Lovenox given.  Return tomorrow for ultrasound.  Appropriate for discharge at this time.        Final Clinical Impression(s) / ED Diagnoses Final diagnoses:  None    Rx / DC Orders ED Discharge Orders     None  Arthor Captain, PA-C 10/28/23 1936    Loetta Rough, MD 10/28/23 [phone]

## 2023-10-28 NOTE — ED Triage Notes (Signed)
 Pt c/o right calf pain intermittent for the past 2 weeks

## 2023-10-29 ENCOUNTER — Ambulatory Visit (HOSPITAL_COMMUNITY)
Admission: RE | Admit: 2023-10-29 | Discharge: 2023-10-29 | Disposition: A | Discharge status: Home / Self Care | Source: Ambulatory Visit | Attending: Emergency Medicine | Admitting: Emergency Medicine

## 2023-10-29 DIAGNOSIS — M7989 Other specified soft tissue disorders: Secondary | ICD-10-CM | POA: Diagnosis not present

## 2023-10-29 DIAGNOSIS — M79604 Pain in right leg: Secondary | ICD-10-CM

## 2023-10-29 DIAGNOSIS — M79661 Pain in right lower leg: Secondary | ICD-10-CM | POA: Insufficient documentation

## 2023-10-29 DIAGNOSIS — Y93B3 Activity, free weights: Secondary | ICD-10-CM | POA: Diagnosis not present
